# Patient Record
Sex: Male | Born: 1940 | Race: Black or African American | Hispanic: No | Marital: Married
Health system: Southern US, Community
[De-identification: ages and names within clinical notes are randomized; demographics above are authoritative.]

## PROBLEM LIST (undated history)

## (undated) DIAGNOSIS — I10 Essential (primary) hypertension: Secondary | ICD-10-CM

---

## 2016-07-11 ENCOUNTER — Emergency Department: Payer: No Typology Code available for payment source

## 2016-07-11 ENCOUNTER — Encounter: Payer: Self-pay | Admitting: Emergency Medicine

## 2016-07-11 ENCOUNTER — Emergency Department
Admission: EM | Admit: 2016-07-11 | Discharge: 2016-07-11 | Disposition: A | Discharge status: Home / Self Care | Payer: No Typology Code available for payment source | Attending: Emergency Medicine | Admitting: Emergency Medicine

## 2016-07-11 DIAGNOSIS — Y92410 Unspecified street and highway as the place of occurrence of the external cause: Secondary | ICD-10-CM | POA: Diagnosis not present

## 2016-07-11 DIAGNOSIS — Y939 Activity, unspecified: Secondary | ICD-10-CM | POA: Insufficient documentation

## 2016-07-11 DIAGNOSIS — I10 Essential (primary) hypertension: Secondary | ICD-10-CM | POA: Diagnosis not present

## 2016-07-11 DIAGNOSIS — Y999 Unspecified external cause status: Secondary | ICD-10-CM | POA: Insufficient documentation

## 2016-07-11 DIAGNOSIS — S299XXA Unspecified injury of thorax, initial encounter: Secondary | ICD-10-CM | POA: Diagnosis present

## 2016-07-11 DIAGNOSIS — S29011A Strain of muscle and tendon of front wall of thorax, initial encounter: Secondary | ICD-10-CM | POA: Insufficient documentation

## 2016-07-11 DIAGNOSIS — S29019A Strain of muscle and tendon of unspecified wall of thorax, initial encounter: Secondary | ICD-10-CM

## 2016-07-11 HISTORY — DX: Essential (primary) hypertension: I10

## 2016-07-11 MED ORDER — NAPROXEN 500 MG PO TABS: 500.0000 mg | ORAL_TABLET | Freq: Two times a day (BID) | ORAL | 0 refills | Status: AC

## 2016-07-11 MED ORDER — METHOCARBAMOL 500 MG PO TABS: 500.0000 mg | ORAL_TABLET | Freq: Four times a day (QID) | ORAL | 0 refills | Status: AC | PRN

## 2016-07-11 MED ORDER — DIAZEPAM 5 MG/ML IJ SOLN
5.0000 mg | Freq: Once | INTRAMUSCULAR | Status: AC
  Administered 2016-07-11: 5 mg via INTRAMUSCULAR
  Filled 2016-07-11: qty 2
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
